# Patient Record
Sex: Female | Born: 1945 | Race: White | Hispanic: No | Marital: Married | Smoking: Never smoker
Health system: Southern US, Community
[De-identification: ages and names within clinical notes are randomized; demographics above are authoritative.]

## PROBLEM LIST (undated history)

## (undated) HISTORY — PX: BREAST SURGERY: SHX581

---

## 1996-02-20 DIAGNOSIS — C50919 Malignant neoplasm of unspecified site of unspecified female breast: Secondary | ICD-10-CM

## 1996-02-20 HISTORY — DX: Malignant neoplasm of unspecified site of unspecified female breast: C50.919

## 2018-02-19 HISTORY — PX: HIP SURGERY: SHX245

## 2021-08-06 ENCOUNTER — Emergency Department (HOSPITAL_COMMUNITY): Payer: Medicare PPO

## 2021-08-06 ENCOUNTER — Encounter (HOSPITAL_COMMUNITY): Payer: Self-pay

## 2021-08-06 ENCOUNTER — Other Ambulatory Visit: Payer: Self-pay

## 2021-08-06 ENCOUNTER — Emergency Department (HOSPITAL_COMMUNITY)
Admission: EM | Admit: 2021-08-06 | Discharge: 2021-08-06 | Disposition: A | Discharge status: Home / Self Care | Payer: Medicare PPO | Attending: Emergency Medicine | Admitting: Emergency Medicine

## 2021-08-06 DIAGNOSIS — T63463A Toxic effect of venom of wasps, assault, initial encounter: Secondary | ICD-10-CM

## 2021-08-06 DIAGNOSIS — Z7901 Long term (current) use of anticoagulants: Secondary | ICD-10-CM

## 2021-08-06 DIAGNOSIS — S0101XA Laceration without foreign body of scalp, initial encounter: Secondary | ICD-10-CM

## 2021-08-06 DIAGNOSIS — W01198A Fall on same level from slipping, tripping and stumbling with subsequent striking against other object, initial encounter: Secondary | ICD-10-CM | POA: Diagnosis not present

## 2021-08-06 DIAGNOSIS — S0990XA Unspecified injury of head, initial encounter: Secondary | ICD-10-CM

## 2021-08-06 DIAGNOSIS — I4891 Unspecified atrial fibrillation: Secondary | ICD-10-CM | POA: Insufficient documentation

## 2021-08-06 DIAGNOSIS — Y9301 Activity, walking, marching and hiking: Secondary | ICD-10-CM | POA: Insufficient documentation

## 2021-08-06 DIAGNOSIS — W19XXXA Unspecified fall, initial encounter: Secondary | ICD-10-CM

## 2021-08-06 MED ORDER — DIPHENHYDRAMINE HCL 25 MG PO TABS: ORAL_TABLET | ORAL | 0 refills | Status: AC

## 2021-08-06 MED ORDER — PREDNISONE 20 MG PO TABS
60.0000 mg | ORAL_TABLET | Freq: Once | ORAL | Status: AC
Start: 2021-08-06 — End: 2021-08-06
  Administered 2021-08-06: 60 mg via ORAL
  Filled 2021-08-06: qty 3

## 2021-08-06 MED ORDER — DIPHENHYDRAMINE HCL 50 MG/ML IJ SOLN
25.0000 mg | Freq: Once | INTRAMUSCULAR | Status: AC
  Administered 2021-08-06: 25 mg via INTRAMUSCULAR
  Filled 2021-08-06: qty 1

## 2021-08-06 MED ORDER — LIDOCAINE-EPINEPHRINE (PF) 2 %-1:200000 IJ SOLN
10.0000 mL | Freq: Once | INTRAMUSCULAR | Status: AC
  Administered 2021-08-06: 10 mL
  Filled 2021-08-06: qty 20

## 2021-08-06 MED ORDER — PREDNISONE 20 MG PO TABS: ORAL_TABLET | ORAL | 0 refills | Status: AC
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
## 2021-08-06 NOTE — Discharge Instructions (Addendum)
1.  Keep your scalp wound clean and dry.  You may apply antibiotic ointment. 2.  Take Benadryl 25 to 50 mg every 6 hours as needed for itching and swelling due to your wasp stings.  Take prednisone as prescribed for the next 3 days. 3.  Follow head injury instructions.  Return immediately if you develop any bad headache, confusion, visual changes other concerning changes 4.  See your doctor for recheck and removal of your staples in 7 to 10 days.

## 2021-08-06 NOTE — ED Notes (Signed)
Discharge instructions reviewed with patient. Patient verbalized understanding of instructions. Follow-up care and medications were reviewed. Patient ambulatory with steady gait. VSS upon discharge.  ?

## 2021-08-06 NOTE — ED Triage Notes (Signed)
PT BIB EMS after tripping and falling backwards on a piece of wood and hitting the back of her head. When pt fell, she also fell into a yellow jacket nest and sustained a sting on her ring finger, rings were cut off PTA, pt denies SOB and inflammation. EMS gave '50mg'$  of Benadryl on scene . Pt is on Elequis.   VSS w/ EMS

## 2021-08-06 NOTE — Progress Notes (Signed)
Orthopedic Tech Progress Note Patient Details:  Renee White 30-Apr-1945 [id number]  Patient ID: [name], female   DOB: 02/27/1945, 76 y.o.   MRN: [id number] Level II; not needed at the moment. Vernona Rieger 08/06/2021, 2:19 PM

## 2022-12-16 IMAGING — CT CT HEAD W/O CM
4 series · 17 of 47 positions shown, 19 images · non-contrast
Comparison: None Available.

CLINICAL DATA: 76-year-old female with head injury from fall. On
blood thinners.



[Series 3: head bone · axial · 0.49mm/px · z∈[-134,-78]mm · 4 of 80 slices shown]
[im 8/80  bone]
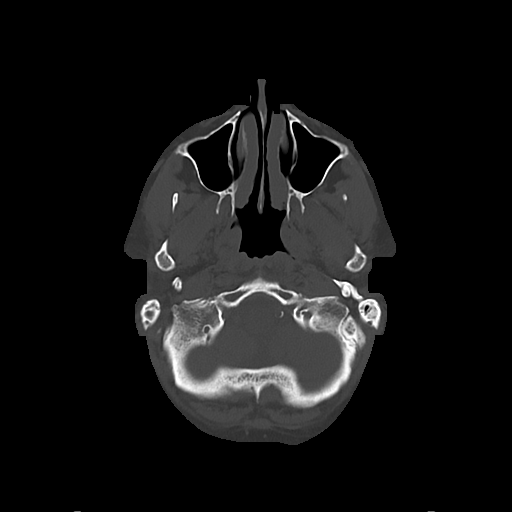
[im 16/80  bone]
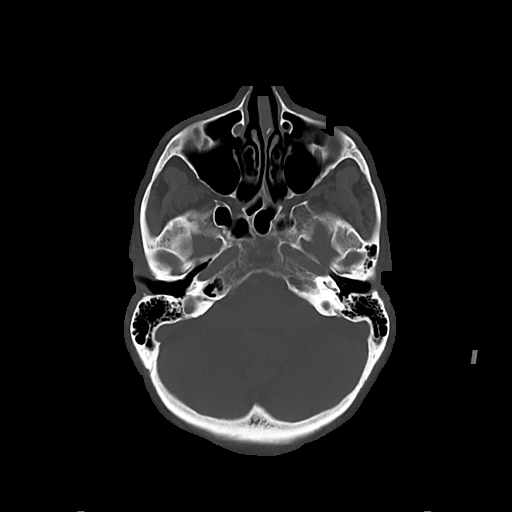
[im 24/80  bone]
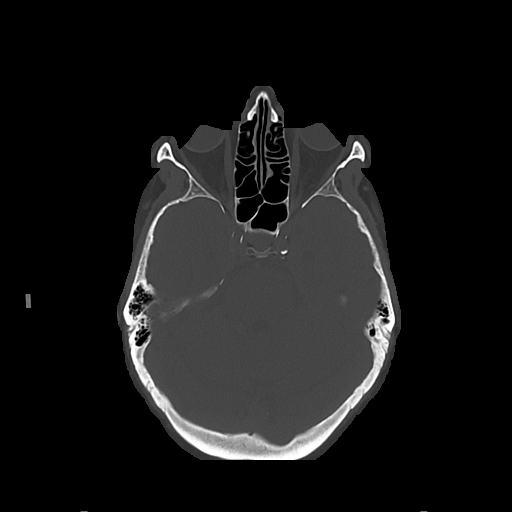
[im 36/80  bone]
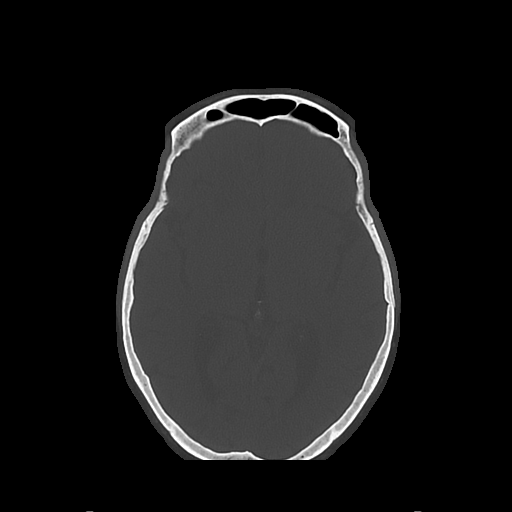

[Series 4: head wo · axial · 0.49mm/px · z∈[-133,-13]mm · 7 of 32 slices shown, 9 images]
[im 4/32  brain]
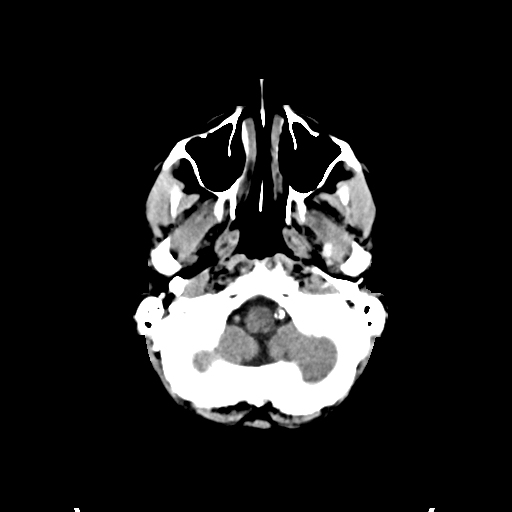
[im 4/32  bone]
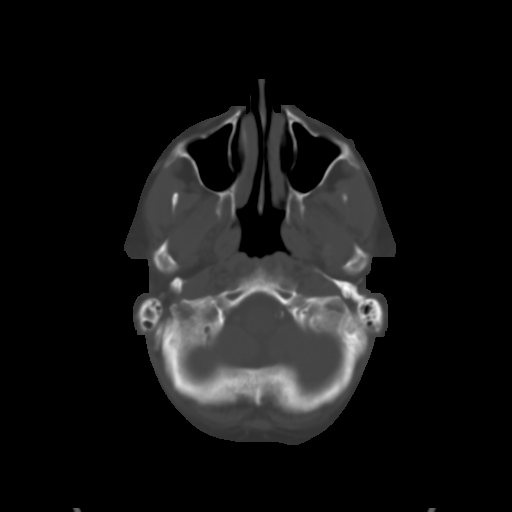
[im 8/32  brain]
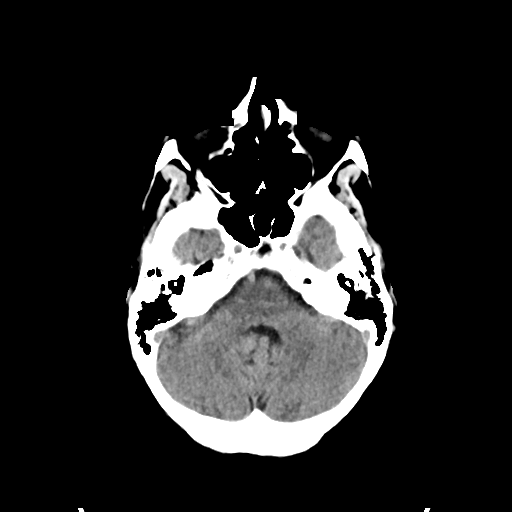
[im 12/32  brain]
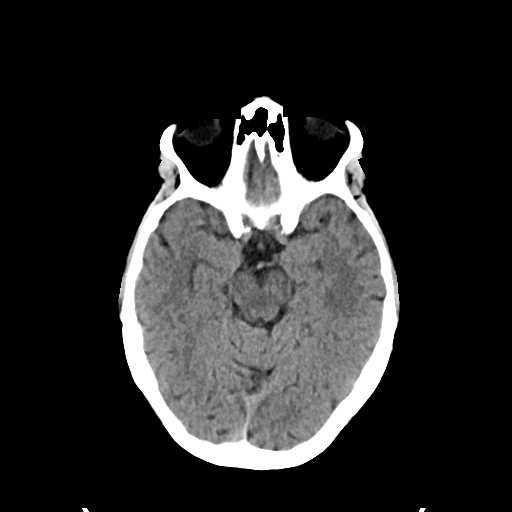
[im 16/32  brain]
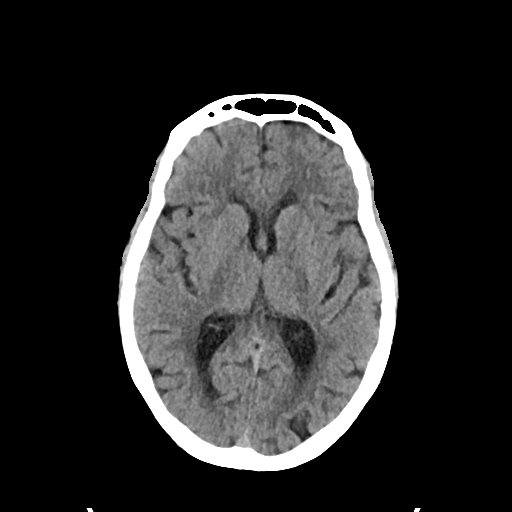
[im 20/32  brain]
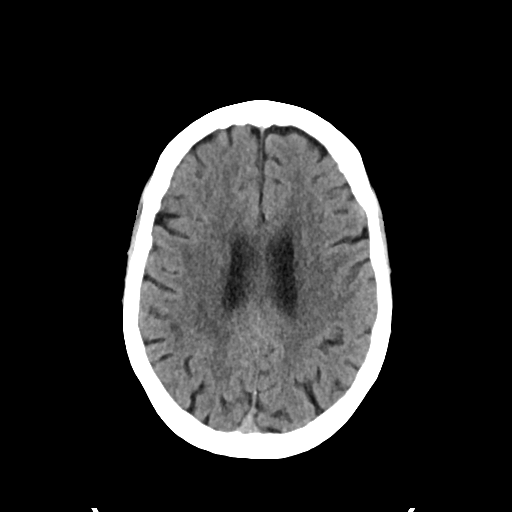
[im 20/32  bone]
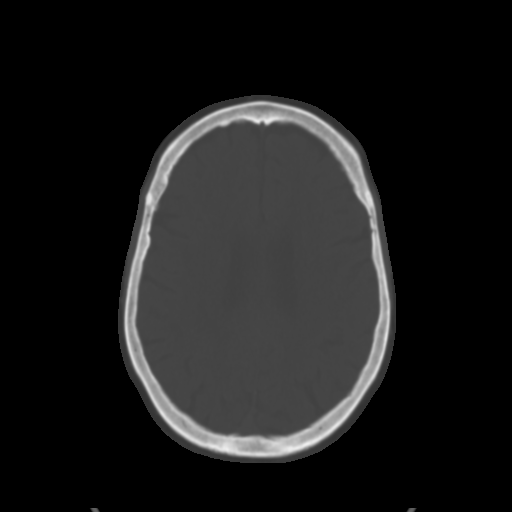
[im 24/32  brain]
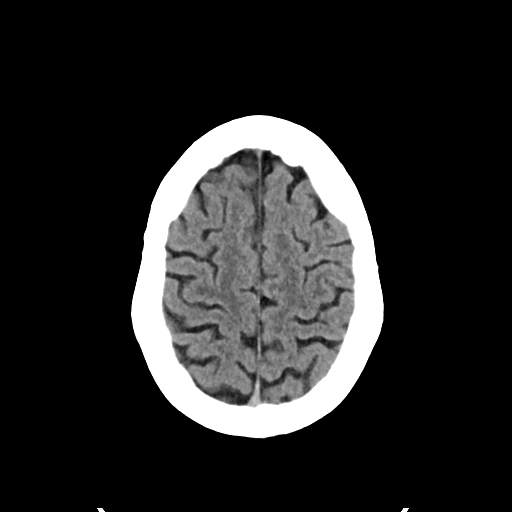
[im 28/32  brain]
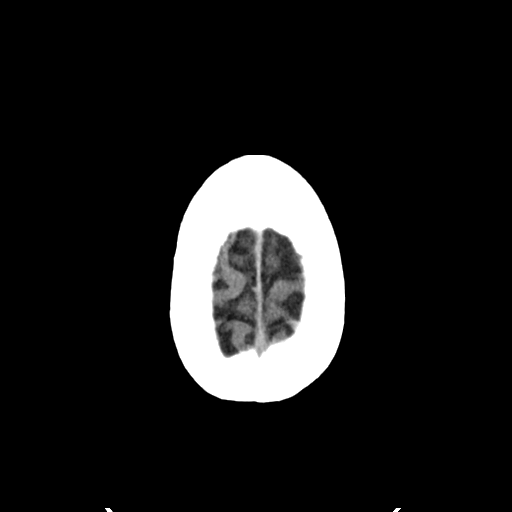

[Series 5: cor soft · coronal · 0.33mm/px · 3 of 69 slices shown]
[im 23/69  brain]
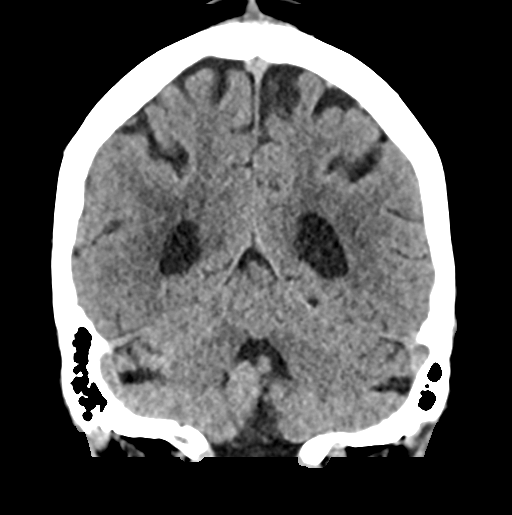
[im 31/69  brain]
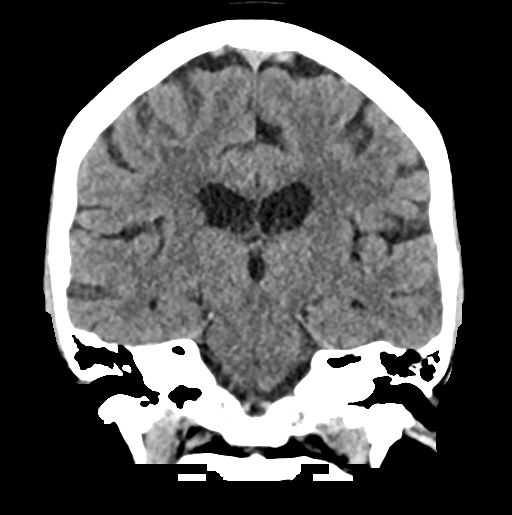
[im 38/69  brain]
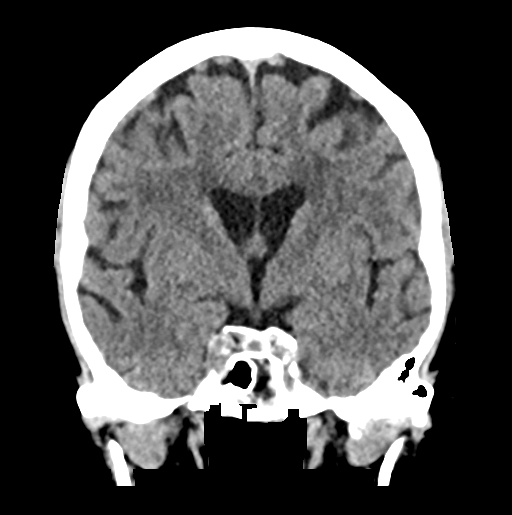

[Series 6: sag soft · sagittal · 0.33mm/px · 3 of 56 slices shown]
[im 19/56  brain]
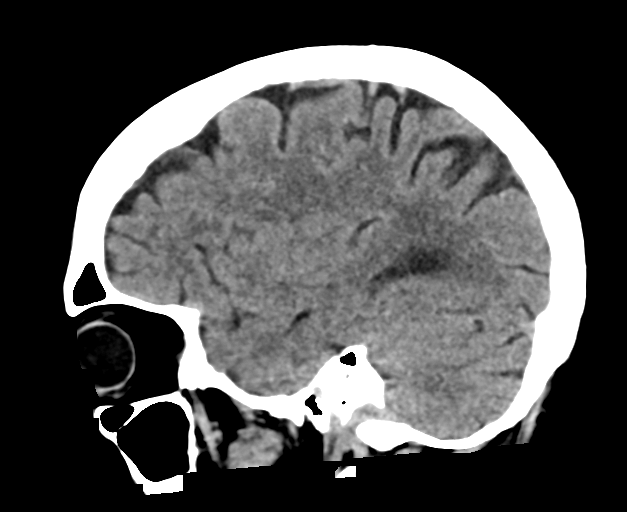
[im 28/56  brain]
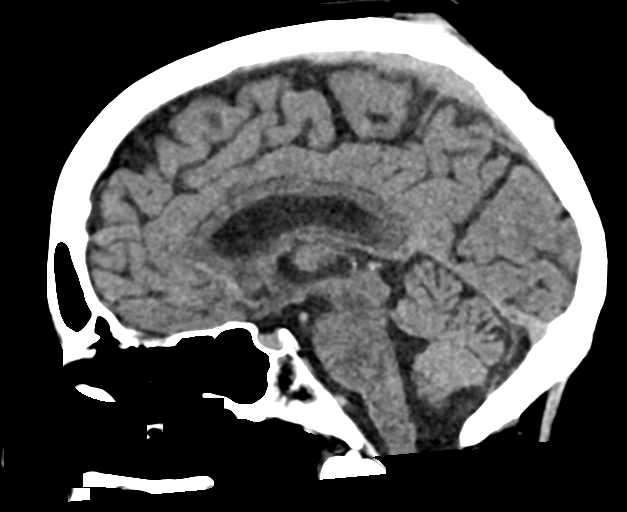
[im 37/56  brain]
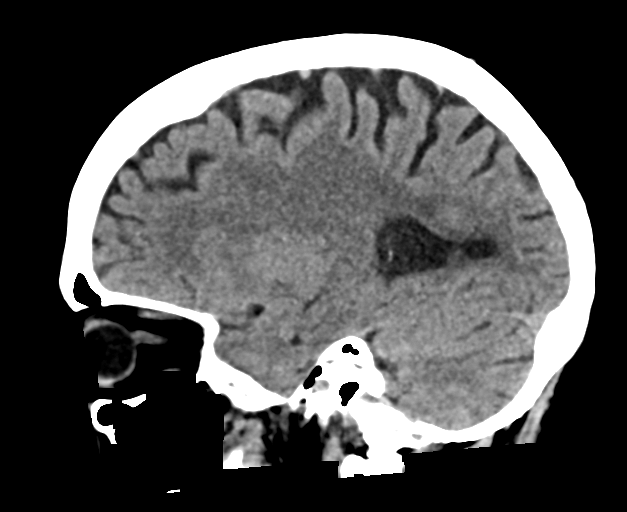

[17 of 47 positions shown; findings below may reference images not displayed]

FINDINGS: Brain: No evidence of acute infarction, hemorrhage, hydrocephalus,
extra-axial collection or mass lesion/mass effect.

Atrophy and probable chronic small-vessel white matter ischemic
changes noted.

Vascular: Carotid and vertebral atherosclerotic calcifications are
noted.

Skull: Normal. Negative for fracture or focal lesion.

Sinuses/Orbits: No acute finding.

Other: Scalp soft tissue swelling at the vertex noted.
IMPRESSION: 1. No evidence of acute intracranial abnormality.
2. Scalp soft tissue swelling without fracture.
3. Atrophy and probable chronic small-vessel white matter ischemic
changes.
